# Patient Record
Sex: Female | Born: 1975 | Race: White | Hispanic: No | Marital: Married | Smoking: Never smoker
Health system: Southern US, Community
[De-identification: ages and names within clinical notes are randomized; demographics above are authoritative.]

## PROBLEM LIST (undated history)

## (undated) DIAGNOSIS — J302 Other seasonal allergic rhinitis: Secondary | ICD-10-CM

## (undated) HISTORY — DX: Other seasonal allergic rhinitis: J30.2

## (undated) HISTORY — PX: TUBAL LIGATION: SHX77

---

## 2005-08-29 ENCOUNTER — Emergency Department (HOSPITAL_COMMUNITY): Admission: EM | Admit: 2005-08-29 | Discharge: 2005-08-29 | Payer: Self-pay | Admitting: Emergency Medicine

## 2006-10-22 ENCOUNTER — Ambulatory Visit (HOSPITAL_COMMUNITY): Admission: AD | Admit: 2006-10-22 | Discharge: 2006-10-22 | Payer: Self-pay | Admitting: Obstetrics and Gynecology

## 2006-11-28 ENCOUNTER — Ambulatory Visit (HOSPITAL_COMMUNITY): Admission: AD | Admit: 2006-11-28 | Discharge: 2006-11-28 | Payer: Self-pay | Admitting: Obstetrics & Gynecology

## 2007-01-25 ENCOUNTER — Inpatient Hospital Stay (HOSPITAL_COMMUNITY): Admission: AD | Admit: 2007-01-25 | Discharge: 2007-01-29 | Payer: Self-pay | Admitting: Obstetrics & Gynecology

## 2007-01-26 ENCOUNTER — Encounter (INDEPENDENT_AMBULATORY_CARE_PROVIDER_SITE_OTHER): Payer: Self-pay | Admitting: Specialist

## 2007-12-12 ENCOUNTER — Other Ambulatory Visit: Admission: RE | Admit: 2007-12-12 | Discharge: 2007-12-12 | Payer: Self-pay | Admitting: Obstetrics & Gynecology

## 2009-01-11 ENCOUNTER — Other Ambulatory Visit: Admission: RE | Admit: 2009-01-11 | Discharge: 2009-01-11 | Payer: Self-pay | Admitting: Obstetrics and Gynecology

## 2009-11-05 ENCOUNTER — Ambulatory Visit (HOSPITAL_COMMUNITY): Admission: RE | Admit: 2009-11-05 | Discharge: 2009-11-05 | Payer: Self-pay | Admitting: Obstetrics & Gynecology

## 2009-11-19 ENCOUNTER — Ambulatory Visit (HOSPITAL_COMMUNITY): Admission: RE | Admit: 2009-11-19 | Discharge: 2009-11-19 | Payer: Self-pay | Admitting: Obstetrics & Gynecology

## 2010-03-11 ENCOUNTER — Inpatient Hospital Stay (HOSPITAL_COMMUNITY): Admission: AD | Admit: 2010-03-11 | Discharge: 2010-03-14 | Payer: Self-pay | Admitting: Obstetrics & Gynecology

## 2010-03-11 ENCOUNTER — Encounter: Payer: Self-pay | Admitting: Obstetrics & Gynecology

## 2010-03-11 ENCOUNTER — Ambulatory Visit: Payer: Self-pay | Admitting: Obstetrics & Gynecology

## 2010-10-13 IMAGING — US US OB DETAIL+14 WK
1 series · 18 of 28 positions shown · non-contrast
Comparison: none

OBSTETRICAL ULTRASOUND:
 This ultrasound was performed in The [HOSPITAL], and the [facility] report will be stored to [REDACTED] PACS.  This report is also available in [HOSPITAL]?s accessANYware.

[Series 1: us ob detail+14 wk · 18 of 88 slices shown]
[im 1/88]
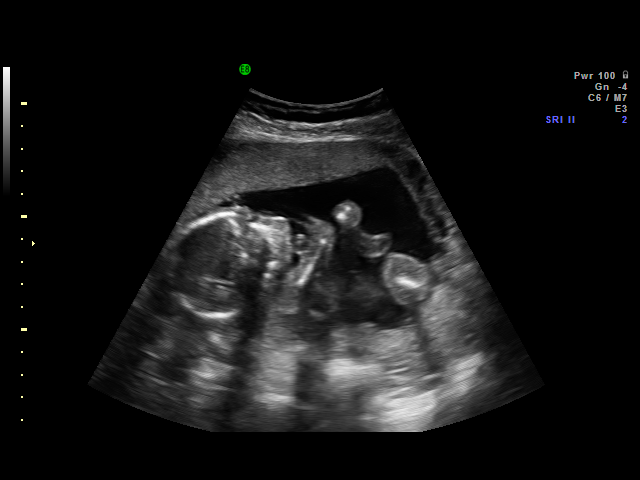
[im 7/88]
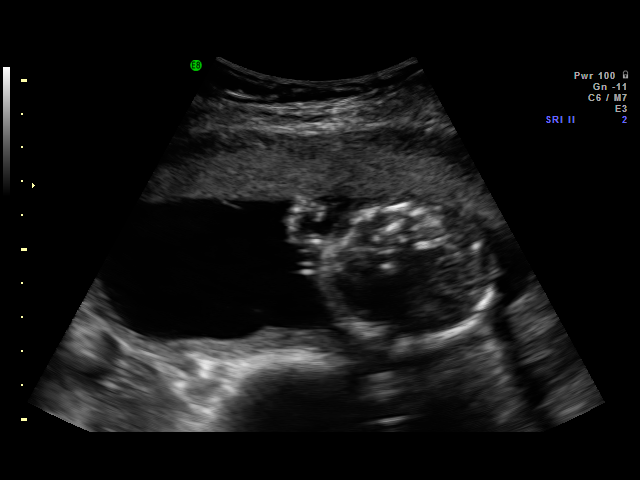
[im 10/88]
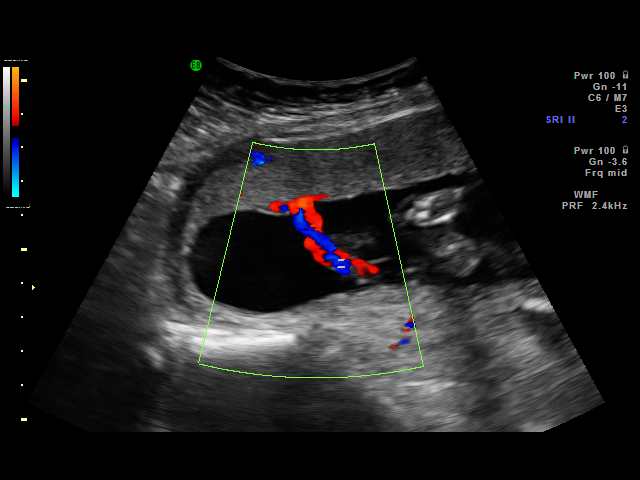
[im 17/88]
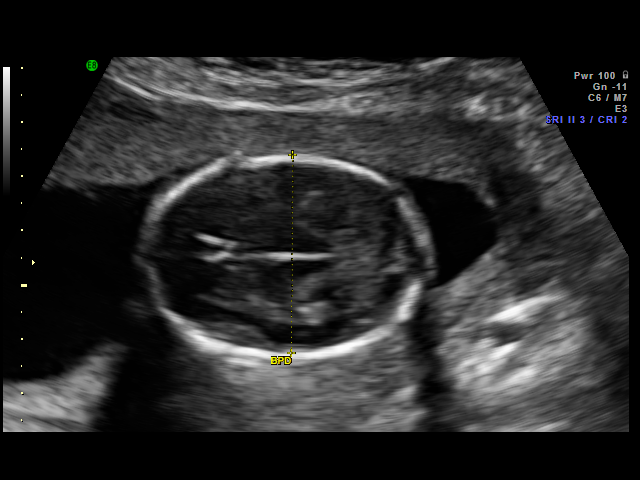
[im 23/88]
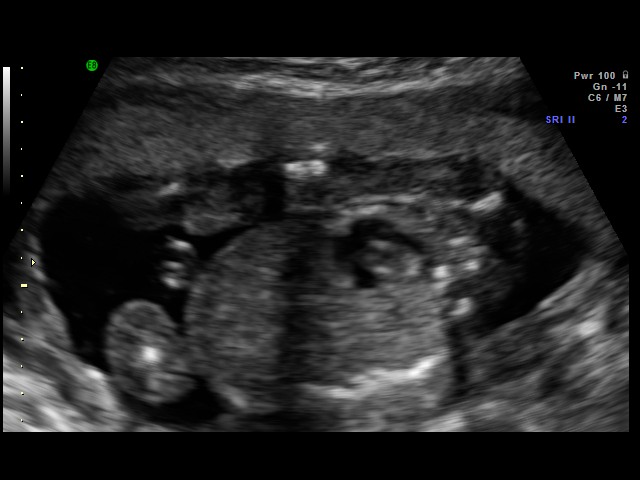
[im 26/88]
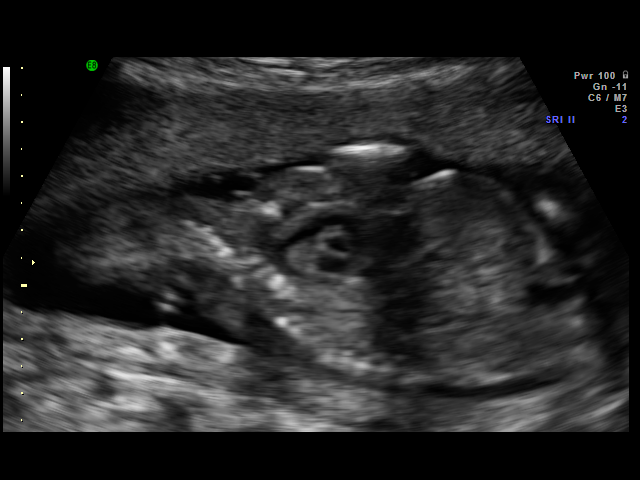
[im 33/88]
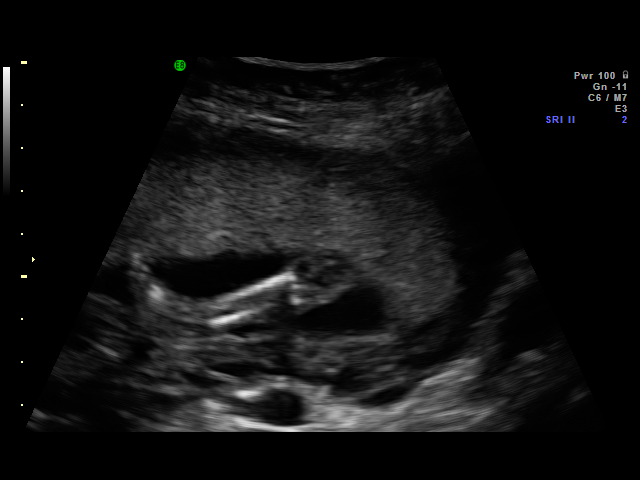
[im 36/88]
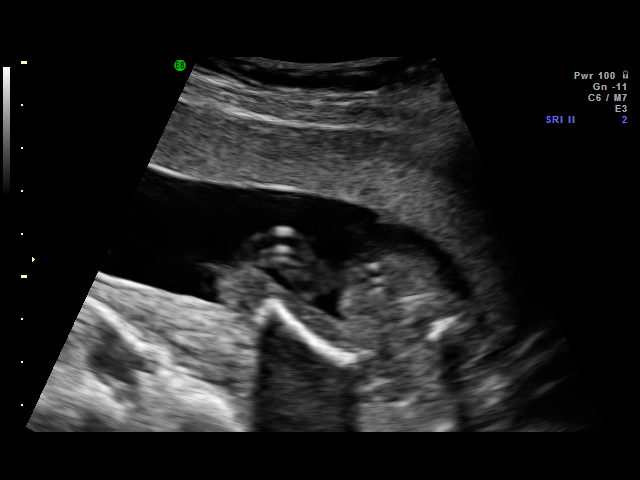
[im 42/88]
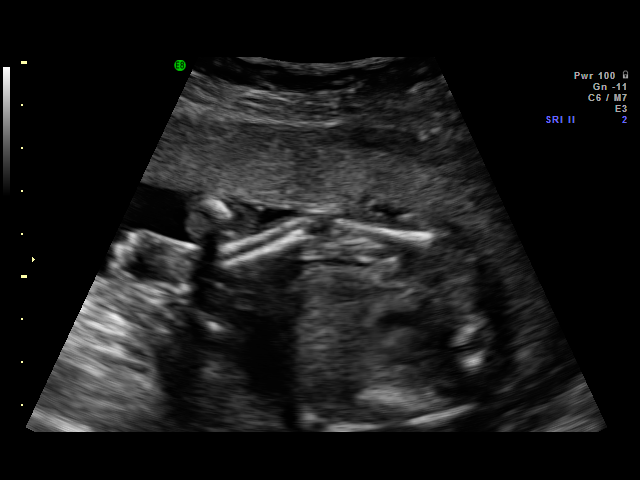
[im 46/88]
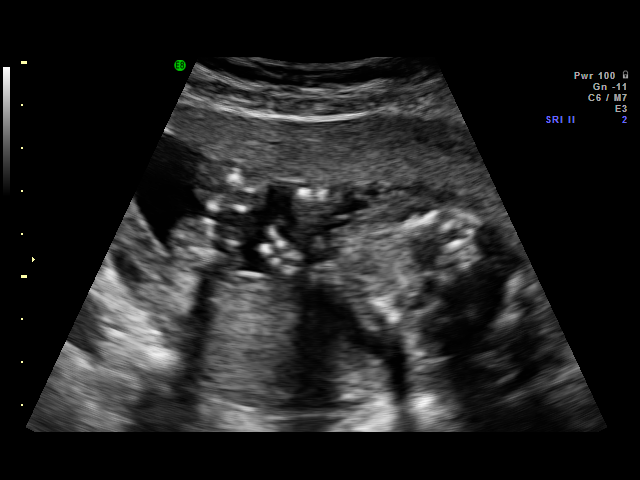
[im 52/88]
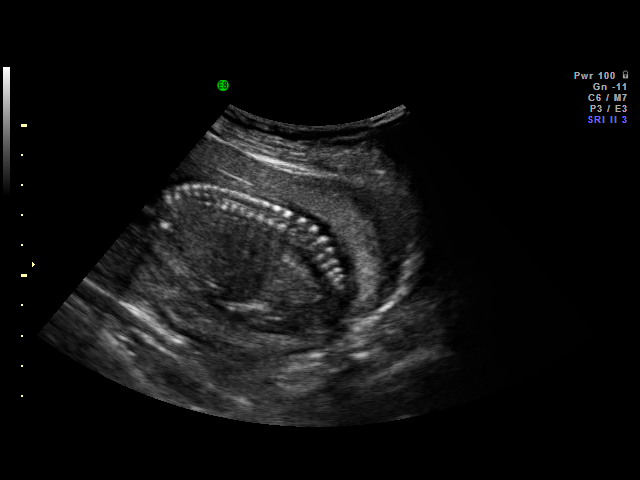
[im 55/88]
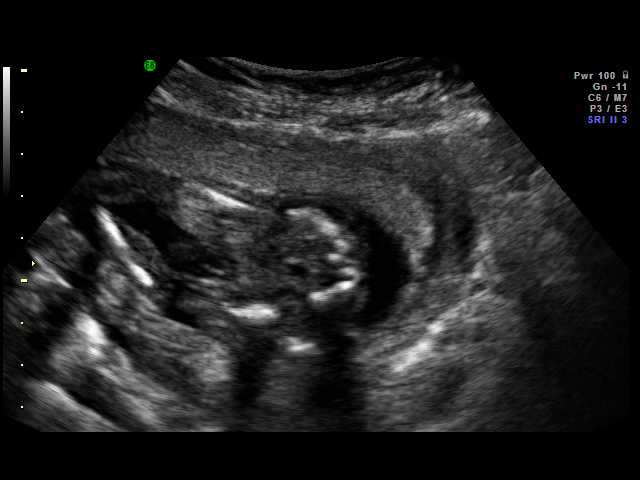
[im 62/88]
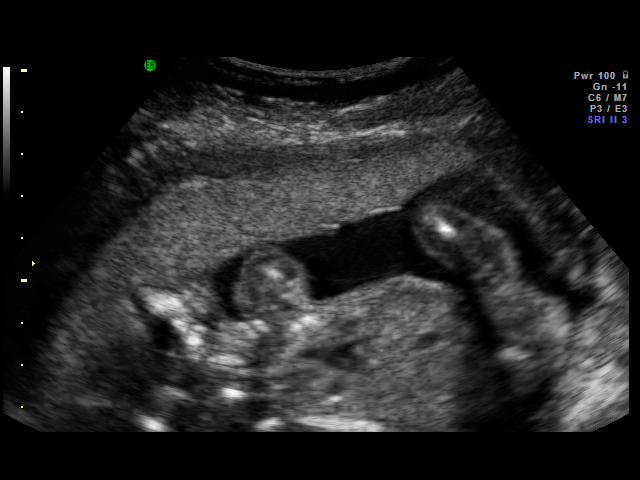
[im 68/88]
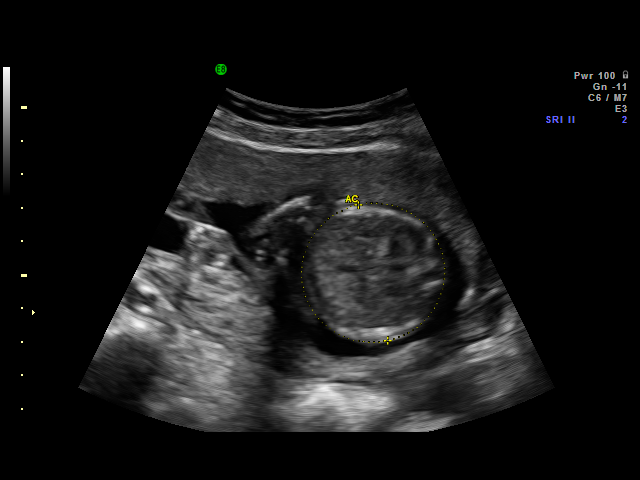
[im 71/88]
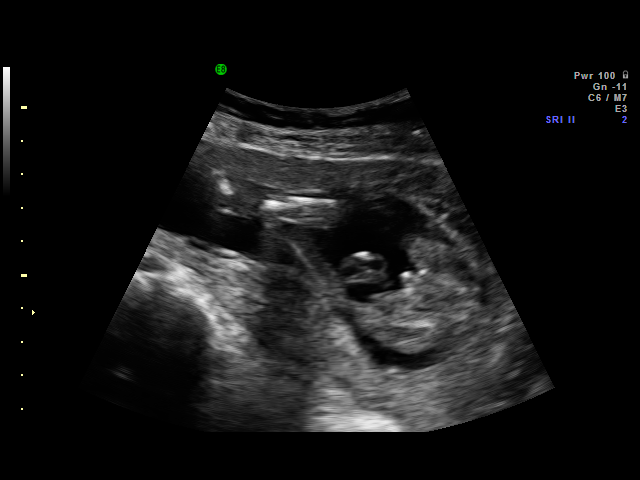
[im 78/88]
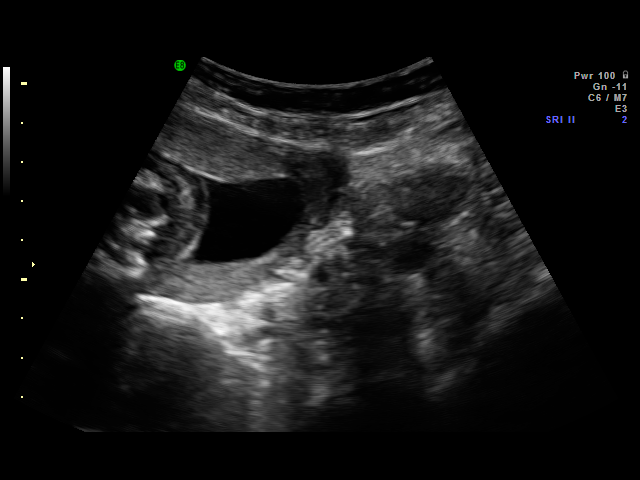
[im 81/88]
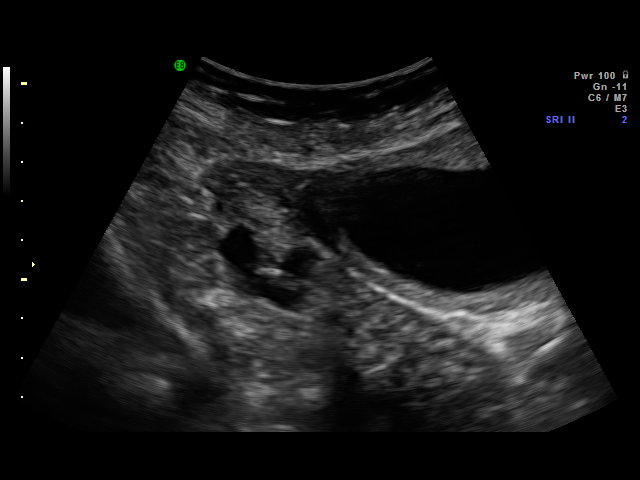
[im 88/88]
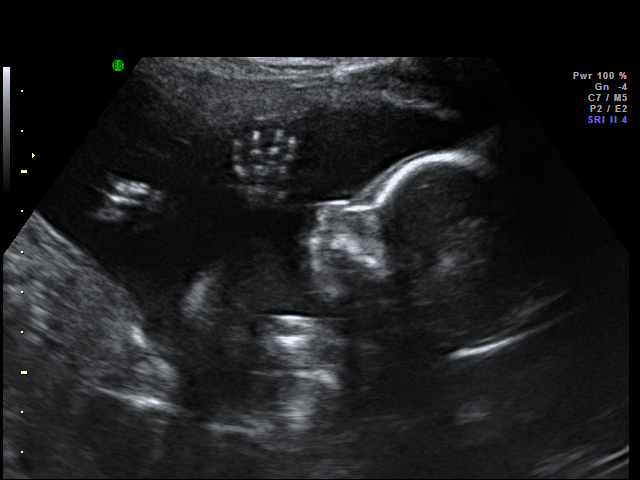

[18 of 28 positions shown; findings below may reference images not displayed]

IMPRESSION: [facility] has also been faxed to the ordering physician.

## 2010-10-17 ENCOUNTER — Other Ambulatory Visit
Admission: RE | Admit: 2010-10-17 | Discharge: 2010-10-17 | Payer: Self-pay | Source: Home / Self Care | Admitting: Obstetrics & Gynecology

## 2011-01-20 LAB — CBC
HCT: 39.4 % (ref 36.0–46.0)
Hemoglobin: 10.8 g/dL — ABNORMAL LOW (ref 12.0–15.0)
MCHC: 34.2 g/dL (ref 30.0–36.0)
MCV: 92.5 fL (ref 78.0–100.0)
Platelets: 241 10*3/uL (ref 150–400)
RBC: 4.26 MIL/uL (ref 3.87–5.11)
WBC: 12.4 10*3/uL — ABNORMAL HIGH (ref 4.0–10.5)

## 2011-01-20 LAB — RPR: RPR Ser Ql: NONREACTIVE

## 2011-01-20 LAB — COMPREHENSIVE METABOLIC PANEL
ALT: 18 U/L (ref 0–35)
AST: 23 U/L (ref 0–37)
BUN: 8 mg/dL (ref 6–23)
Calcium: 9.7 mg/dL (ref 8.4–10.5)
Chloride: 106 mEq/L (ref 96–112)
Creatinine, Ser: 0.58 mg/dL (ref 0.4–1.2)
GFR calc non Af Amer: >60 mL/min (ref >60)
Potassium: 4.6 mEq/L (ref 3.5–5.1)
Sodium: 135 mEq/L (ref 135–145)
Total Bilirubin: 0.5 mg/dL (ref 0.3–1.2)
Total Protein: 6.5 g/dL (ref 6.0–8.3)

## 2011-01-20 LAB — URIC ACID: Uric Acid, Serum: 5.4 mg/dL (ref 2.4–7.0)

## 2011-03-20 NOTE — Op Note (Signed)
[name], [name]               ACCOUNT NO.:  [id number]   MEDICAL RECORD NO.:  [id number]          PATIENT TYPE:  INP   LOCATION:  A401                          FACILITY:  [facility]   PHYSICIAN:  Tilda Burrow, M.D. DATE OF BIRTH:  [birth year]   DATE OF PROCEDURE:  01/26/2007  DATE OF DISCHARGE:                               OPERATIVE REPORT   PREOPERATIVE DIAGNOSIS:  Pregnancy, 38 weeks 5 days, pre-eclampsia,  cephalopelvic disproportion.   POSTOPERATIVE DIAGNOSIS:  Pregnancy, 38 weeks 5 days, pre-eclampsia,  cephalopelvic disproportion, loose nuchal cord x1, body cord x1.   PROCEDURE:  Primary low transverse cervical cesarean section.   SURGEON:  Tilda Burrow, M.D.   ASSISTANT:  Zerita Boers, C.N.M.   ANESTHESIA:  Epidural.   ANESTHESIOLOGIST:  Tilda Burrow, M.D.   COMPLICATIONS:  None.   FINDINGS:  8 pounds 1.3 ounce female infant, Apgars 9/9, occiput posterior  presentation, nuchal cord x1, body cord x1.   INDICATIONS FOR PROCEDURE:  The patient reached completely dilated and  pushed for approximately an hour with absolutely no change in the  descent of the vertex, head at +1 station.  The pelvis was considered  very funneled with prominent coccyx as well as a very low positioned  pubic ramus.   DETAILS OF PROCEDURE:  The patient was taken to the operating room after  CPD diagnosed and epidural topped off by Glynn Octave, CRNA.  A  Pfannenstiel incision was made with easy development of the incision.  There were huge varicosities in the anterior abdominal wall that  required point cautery.  The peritoneum was entered easily without  difficulty, the bladder flap developed on the well developed low uterine  segment and a transverse uterine incision performed without difficulty  and extended laterally with index finger traction and fetal vertex eased  into the incision, rotated from its occiput posterior presentation into  the incision, and delivered by fundal  pressure.  He would not release  completely, so vacuum assistance was placed on the occiput and used to  guide the baby between the rectus muscles without further difficulty.  Nuchal cord x1 and body cord x1 was unwound.  The infant was delivered  and passed to Dr. Milinda Cave for his subsequent care, see his notes for  details.   The infant was taken upstairs.  Cord blood samples were obtained,  results are pending elsewhere.  The uterus was irrigated with saline  solution and closed with a single layer of running locking closure and  subsequent antibiotic dose of Ancef given IV.  One interrupted suture  was necessary on the lower uterine segment to complete hemostasis here.  The bladder flap was closed using 2-0 chromic.  The flexible retractor  was removed and the anterior peritoneum closed with 2-0 chromic, the  rectus muscles reapproximated with interrupted 2-0 plain x3.  The fascia  was closed with 0 Vicryl.  The subcutaneous fatty tissues were  reapproximated with interrupted 2-0 plain and staple closure of the skin  completed the procedure.  The patient tolerated the procedure well and  went to the recovery  room in excellent condition.  Estimated blood loss  500 mL.  Urine output approximately 125 mL, though Foley bag was changed  due to leakage of the bag.  The Foley urine output was slightly bloody  from the patient's pushing.      Tilda Burrow, M.D.  Electronically Signed     JVF/MEDQ  D:  01/26/2007  T:  01/26/2007  Job:  [id number]   cc:   Jeoffrey Massed, MD  Fax: [phone]

## 2011-03-20 NOTE — H&P (Signed)
[name], [name]               ACCOUNT NO.:  [id number]   MEDICAL RECORD NO.:  [id number]          PATIENT TYPE:  OIB   LOCATION:  LDR3                          FACILITY:  [facility]   PHYSICIAN:  Lazaro Arms, M.D.   DATE OF BIRTH:  03-04-1976   DATE OF ADMISSION:  01/25/2007  DATE OF DISCHARGE:  LH                              HISTORY & PHYSICAL   REASON FOR ADMISSION:  Pregnancy at 38 weeks and 5 days with  preeclampsia.   MEDICAL HISTORY:  Negative.   PAST SURGICAL HISTORY:  Negative.   ALLERGIES:  She is allergic to SULFA.   FAMILY HISTORY:  Positive for hypertension.   PRENATAL COURSE:  Essentially uneventful. Blood type AB positive. UDS  negative, rubella immune, hepatitis B surface antigen negative, HIV  negative. GC, chlamydia negative on both cultures. HSV-2 negative. AFP  within normal limits. GBS is positive. A 28-week hemoglobin 12.1,  hematocrit 37.6. One hour glucose 154. GBS is positive.   PHYSICAL EXAMINATION:  VITAL SIGNS:  Weight 168, blood pressure 152/92.  There is a trace of protein in her urine.  EXTREMITIES:  There is 3+, 4+ pitting edema from the thighs down.  ABDOMEN:  Positive fetal movement. Fundal height is 37 cm. Fetal heart  rate is 120 strong and regular. There are 4+ DTRs.  HEART:  Regular rhythm and rate.  LUNGS:  Clear to auscultation bilaterally.  NEUROLOGICAL:  There is no clonus with DTRs.  GENITORECTAL:  Vaginal exam:  Cervix is a fingertip, 70%, -2 station.  She does have a flat pelvic arch.   Dr. Despina Hidden was consulted and examined the patient. Discussed risks and  benefits in regards to Cesarean section versus induction and vaginal  delivery. Patient opts for cesarean section when the decision is made  for time of delivery. Patient verbalized understanding in regards to  diagnosis, plan of care. She is to be admitted today.      Zerita Boers, N.M.      Lazaro Arms, M.D.  Electronically Signed    DL/MEDQ  D:  16/08/9603   T:  01/25/2007  Job:  [id number]   cc:   Jeoffrey Massed, MD  Fax: [phone]   [facility]

## 2011-03-20 NOTE — Consult Note (Signed)
[name], [name]               ACCOUNT NO.:  [id number]   MEDICAL RECORD NO.:  [id number]          PATIENT TYPE:  OIB   LOCATION:  A415                          FACILITY:  [facility]   PHYSICIAN:  Tilda Burrow, M.D. DATE OF BIRTH:  Mar 27, 1976   DATE OF CONSULTATION:  DATE OF DISCHARGE:                                 CONSULTATION   CHIEF COMPLAINT:  Left lower quadrant abdominal pain, nausea and  vomiting.   HISTORY OF PRESENT ILLNESS:  A 35 year old primiparous female due February 03, 2007 by menstrual and ultrasound criteria, who presents complaining  of abdominal discomfort, later in the day after a routine office visit.  She received Nubain and Phenergan, which helped her with her pain.  Urinalysis was negative. An abdominal examination shows non-tender  abdomen. There are no uterine contractions.   IMPRESSION:  She either has early gastrointestinal discomfort or  functional discomfort with pregnancy.   PLAN:  She will be followed up in our office. The patient is to call for  persistent discomfort through the weekend.      Tilda Burrow, M.D.  Electronically Signed     JVF/MEDQ  D:  10/22/2006  T:  10/24/2006  Job:  [id number]

## 2011-03-20 NOTE — Discharge Summary (Signed)
[name], [name]               ACCOUNT NO.:  [id number]   MEDICAL RECORD NO.:  [id number]          PATIENT TYPE:  INP   LOCATION:  A401                          FACILITY:  [facility]   PHYSICIAN:  Tilda Burrow, M.D. DATE OF BIRTH:  [birth year]   DATE OF ADMISSION:  01/25/2007  DATE OF DISCHARGE:  03/29/2008LH                               DISCHARGE SUMMARY   ADMISSION DIAGNOSES:  1. Pregnancy at 38 weeks and five days.  2. Pre-eclampsia.  3. Medically-indicated induction of labor.   DISCHARGE DIAGNOSES:  1. Pregnancy at 38 weeks and five days.  2. Mild pre-eclampsia.  3. Cephalopelvic disproportion.   PROCEDURE:  1. On January 25, 2007, a Foley bulb,  cervical ripening, Dr. Tilda Burrow.  Pitocin induction of labor.  2. On January 26, 2007, Pitocin induction of labor.  Also continuous      lumbar epidural catheter placement , Dr. Emelda Fear.  Also on January 26, 2007, a primary low transverse cervical cesarean section, Dr.      Emelda Fear.   DISCHARGE MEDICATIONS:  1. Motrin 800 mg, one p.o. q.8h. p.r.n. cramps or pain.  2. Hydrochlorothiazide 25 mg p.o. q.d. x30 days.  3. K-Dur 20 mEq, #30 tab, one p.o. q.a.m. x30 days.  4. Prenatal vitamin, one p.o. q.d.   FOLLOWUP:  In two days for staple removal and incision check, then in  four weeks.   HISTORY:  This 35 year old primiparous female before March 2008, was  admitted for pre-eclampsia.  Examination  in the office raised the  question of adequacy of the pelvis due to a very convergent outlet, a  flat sacrum anteriorly and narrow pelvic side walls.  The cervix was  favorable at 2 cm.  After a discussion, we decided to attempt an  induction rather than a primary cesarean section.   HOSPITAL COURSE:  She reached completely dilated by 5 p.m. on January 26, 2007, but could not push the baby absolutely no way past +1 station.  A  primary cesarean section was performed.  The vertex was in a occipital  posterior  presentation.  There was an 8 pound 1.3 ounce infant with  nuchal cord x1, body cord x1.   Postoperatively the patient did excellently.  She had been admitted with  an admitting hemoglobin of 11.8, hematocrit 35.4, with platelets of  301,000.  Postoperatively her hemoglobin was 10.1, hematocrit 29.9,  platelets remained excellent at 254,000.  She had marked 4+ edema upon  admission, and this remained consistent during the hospitalization.  Her  weight was 168 pounds on admission and dropped to 164 on postoperative  day one.   DISPOSITION/CONDITION ON DISCHARGE:  She was discharged in excellent  condition, having chosen to bottle feed.   FOLLOWUP:  She will be followed up shortly in our office.  Contraceptions plans are undecided.      Tilda Burrow, M.D.  Electronically Signed     JVF/MEDQ  D:  01/29/2007  T:  01/29/2007  Job:  [id number]   cc:   Loistine Chance  Hoover Browns, MD  Fax: [phone]

## 2011-03-20 NOTE — Op Note (Signed)
[name], [name]               ACCOUNT NO.:  [id number]   MEDICAL RECORD NO.:  [id number]          PATIENT TYPE:  INP   LOCATION:  LDR3                          FACILITY:  [facility]   PHYSICIAN:  Tilda Burrow, M.D. DATE OF BIRTH:  October 14, 1976   DATE OF PROCEDURE:  01/26/2007  DATE OF DISCHARGE:                               OPERATIVE REPORT   PROCEDURE PERFORMED:  Epidural catheter placement.   INDICATIONS FOR PROCEDURE:  Medically indicated induction for pre-  eclampsia with cervical dilation to 4 cm.   DETAILS OF PROCEDURE:  Continuous lumbar epidural catheter placed after  fluid bolus and consent obtained with the patient sitting in position  flexed forward.  L3-4 interspace was prepped, draped, local wheal of  anesthesia performed followed by insertion of a Tuohy needle.  A loss of  resistance technique identified the epidural space at 6 cm beneath the  skin with 3 mL test dose of 1.5% Xylocaine with epinephrine injected.  The epidural catheter was threaded 3.5 cm into the epidural space, Tuohy  needle removed, catheter taped to the back and an additional 3 mL of  1.5% lidocaine with epinephrine injected through the catheter.  Maternal  heart rate remained normal.  The patient then had a bolus of 7 mL of  0.125% Marcaine solution followed by 14 mL per hour.  She had T8  analgesic effect noted and normal blood pressures noted.  Cervical exam  post procedure showed the cervix stretchable to 4 cm, 75% effaced, -2.  Patient has a very low symphysis pubis and this will an issue during  delivery, but contractions are excellent.  Fetal heart rate remains  normal.  Good prognosis for vaginal delivery is anticipated.      Tilda Burrow, M.D.  Electronically Signed     JVF/MEDQ  D:  01/26/2007  T:  01/26/2007  Job:  [id number]

## 2012-02-19 ENCOUNTER — Other Ambulatory Visit: Payer: Self-pay | Admitting: Obstetrics & Gynecology

## 2012-02-19 ENCOUNTER — Other Ambulatory Visit (HOSPITAL_COMMUNITY)
Admission: RE | Admit: 2012-02-19 | Discharge: 2012-02-19 | Disposition: A | Discharge status: Home / Self Care | Payer: BC Managed Care – PPO | Source: Ambulatory Visit | Attending: Obstetrics & Gynecology | Admitting: Obstetrics & Gynecology

## 2012-02-19 DIAGNOSIS — Z01419 Encounter for gynecological examination (general) (routine) without abnormal findings: Secondary | ICD-10-CM | POA: Insufficient documentation

## 2013-03-09 ENCOUNTER — Encounter: Payer: Self-pay | Admitting: *Deleted

## 2013-03-10 ENCOUNTER — Encounter: Payer: Self-pay | Admitting: Obstetrics & Gynecology

## 2013-03-10 ENCOUNTER — Ambulatory Visit (INDEPENDENT_AMBULATORY_CARE_PROVIDER_SITE_OTHER): Payer: BC Managed Care – PPO | Admitting: Obstetrics & Gynecology

## 2013-03-10 VITALS — BP 90/60 | Ht 65.0 in | Wt 128.0 lb

## 2013-03-10 DIAGNOSIS — Z01419 Encounter for gynecological examination (general) (routine) without abnormal findings: Secondary | ICD-10-CM

## 2013-03-10 NOTE — Progress Notes (Signed)
Patient ID: [name], female   DOB: 1975/12/15, 37 y.o.   MRN: [id number] Subjective:     [name] is a 37 y.o. female here for a routine exam.  Patient's last menstrual period was 03/07/2013. G2P1100 Current complaints: none.  Personal health questionnaire reviewed: yes.   Gynecologic History Patient's last menstrual period was 03/07/2013. Contraception: tubal ligation Last Pap: 2013. Results were: normal Last mammogram: na. Results were: na  Obstetric History OB History   Grav Para Term Preterm Abortions TAB SAB Ect Mult Living   2 2 1 1            # Outc Date GA Lbr Len/2nd Wgt Sex Del Anes PTL Lv   1 TRM 3/08 [redacted]w[redacted]d  8lb1.3oz(3.666kg) M LTCS EPI     2 PRE 5/11 [redacted]w[redacted]d  5lb10oz(2.551kg) M LTCS Spinal         The following portions of the patient's history were reviewed and updated as appropriate: allergies, current medications, past family history, past medical history, past social history, past surgical history and problem list.  Review of Systems  Review of Systems  Constitutional: Negative for fever, chills, weight loss, malaise/fatigue and diaphoresis.  HENT: Negative for hearing loss, ear pain, nosebleeds, congestion, sore throat, neck pain, tinnitus and ear discharge.   Eyes: Negative for blurred vision, double vision, photophobia, pain, discharge and redness.  Respiratory: Negative for cough, hemoptysis, sputum production, shortness of breath, wheezing and stridor.   Cardiovascular: Negative for chest pain, palpitations, orthopnea, claudication, leg swelling and PND.  Gastrointestinal: negative for abdominal pain. Negative for heartburn, nausea, vomiting, diarrhea, constipation, blood in stool and melena.  Genitourinary: Negative for dysuria, urgency, frequency, hematuria and flank pain.  Musculoskeletal: Negative for myalgias, back pain, joint pain and falls.  Skin: Negative for itching and rash.  Neurological: Negative for dizziness, tingling, tremors,  sensory change, speech change, focal weakness, seizures, loss of consciousness, weakness and headaches.  Endo/Heme/Allergies: Negative for environmental allergies and polydipsia. Does not bruise/bleed easily.  Psychiatric/Behavioral: Negative for depression, suicidal ideas, hallucinations, memory loss and substance abuse. The patient is not nervous/anxious and does not have insomnia.        Objective:    Physical Exam  Vitals reviewed. Constitutional: She is oriented to person, place, and time. She appears well-developed and well-nourished.  HENT:  Head: Normocephalic and atraumatic.        Right Ear: External ear normal.  Left Ear: External ear normal.  Nose: Nose normal.  Mouth/Throat: Oropharynx is clear and moist.  Eyes: Conjunctivae and EOM are normal. Pupils are equal, round, and reactive to light. Right eye exhibits no discharge. Left eye exhibits no discharge. No scleral icterus.  Neck: Normal range of motion. Neck supple. No tracheal deviation present. No thyromegaly present.  Cardiovascular: Normal rate, regular rhythm, normal heart sounds and intact distal pulses.  Exam reveals no gallop and no friction rub.   No murmur heard. Respiratory: Effort normal and breath sounds normal. No respiratory distress. She has no wheezes. She has no rales. She exhibits no tenderness.  GI: Soft. Bowel sounds are normal. She exhibits no distension and no mass. There is no tenderness. There is no rebound and no guarding.  Genitourinary:       Vulva is normal without lesions Vagina is pink moist without discharge Cervix normal in appearance and pap is done Uterus is normal size shape and contour Adnexa is negative with normal sized ovaries   Musculoskeletal: Normal range of motion. She exhibits no  edema and no tenderness.  Neurological: She is alert and oriented to person, place, and time. She has normal reflexes. She displays normal reflexes. No cranial nerve deficit. She exhibits normal muscle  tone. Coordination normal.  Skin: Skin is warm and dry. No rash noted. No erythema. No pallor.  Psychiatric: She has a normal mood and affect. Her behavior is normal. Judgment and thought content normal.       Assessment:    Healthy female exam.    Plan:    Education reviewed: none. Mammogram ordered.

## 2013-03-10 NOTE — Patient Instructions (Addendum)
Mammography Mammography is an X-ray of the breasts to look for changes that are not normal. The X-ray image is called a mammogram. This procedure can screen for breast cancer, can detect cancer early, and can diagnose cancer.  LET YOUR CAREGIVER KNOW ABOUT:  Breast implants.  Previous breast disease, biopsy, or surgery.  If you are breastfeeding.  Medicines taken, including vitamins, herbs, eyedrops, over-the-counter medicines, and creams.  Use of steroids (by mouth or creams).  Possibility of pregnancy, if this applies. RISKS AND COMPLICATIONS  Exposure to radiation, but at very low levels.  The results may be misinterpreted.  The results may not be accurate.  Mammography may lead to further tests.  Mammography may not catch certain cancers. BEFORE THE PROCEDURE  Schedule your test about 7 days after your menstrual period. This is when your breasts are the least tender and have signs of hormone changes.  If you have had a mammography done at a different facility in the past, get the mammogram X-rays or have them sent to your current exam facility in order to compare them.  Wash your breasts and under your arms the day of the test.  Do not wear deodorants, perfumes, or powders anywhere on your body.  Wear clothes that you can change in and out of easily. PROCEDURE Relax as much as possible during the test. Any discomfort during the test will be very brief. The test should take less than 30 minutes. The following will happen:  You will undress from the waist up and put on a gown.  You will stand in front of the X-ray machine.  Each breast will be placed between 2 plastic or glass plates. The plates will compress your breast for a few seconds.  X-rays will be taken from different angles of the breast. AFTER THE PROCEDURE  The mammogram will be examined.  Depending on the quality of the images, you may need to repeat certain parts of the test.  Ask when your test  results will be ready. Make sure you get your test results.  You may resume normal activities. Document Released: 10/16/2000 Document Revised: 01/11/2012 Document Reviewed: 08/09/2011 [employer] Patient Information 2013 [employer].  

## 2013-03-17 ENCOUNTER — Other Ambulatory Visit: Payer: Self-pay | Admitting: Obstetrics & Gynecology

## 2014-08-28 ENCOUNTER — Ambulatory Visit (INDEPENDENT_AMBULATORY_CARE_PROVIDER_SITE_OTHER): Payer: BC Managed Care – PPO | Admitting: Obstetrics & Gynecology

## 2014-08-28 ENCOUNTER — Encounter: Payer: Self-pay | Admitting: Obstetrics & Gynecology

## 2014-08-28 ENCOUNTER — Other Ambulatory Visit (HOSPITAL_COMMUNITY)
Admission: RE | Admit: 2014-08-28 | Discharge: 2014-08-28 | Disposition: A | Discharge status: Home / Self Care | Payer: BC Managed Care – PPO | Source: Ambulatory Visit | Attending: Obstetrics & Gynecology | Admitting: Obstetrics & Gynecology

## 2014-08-28 VITALS — BP 108/80 | Ht 65.0 in | Wt 120.0 lb

## 2014-08-28 DIAGNOSIS — Z1151 Encounter for screening for human papillomavirus (HPV): Secondary | ICD-10-CM | POA: Insufficient documentation

## 2014-08-28 DIAGNOSIS — Z01419 Encounter for gynecological examination (general) (routine) without abnormal findings: Secondary | ICD-10-CM

## 2014-08-28 NOTE — Progress Notes (Signed)
Patient ID: [name], female   DOB: 19-Sep-1976, 38 y.o.   MRN: [id number] Subjective:     [name] is a 38 y.o. female here for a routine exam.  Patient's last menstrual period was 08/02/2014. G2P1100 Birth Control Method:  BTL Menstrual Calendar(currently): regular  Current complaints: none.   Current acute medical issues:  none   Recent Gynecologic History Patient's last menstrual period was 08/02/2014. Last Pap: 2014,  normal Last mammogram: na,    Past Medical History  Diagnosis Date  . Seasonal allergies     Past Surgical History  Procedure Laterality Date  . Cesarean section  2008,2011  . Tubal ligation      OB History   Grav Para Term Preterm Abortions TAB SAB Ect Mult Living   2 2 1 1             History   Social History  . Marital Status: Married    Spouse Name: N/A    Number of Children: N/A  . Years of Education: N/A   Social History Main Topics  . Smoking status: Never Smoker   . Smokeless tobacco: Never Used  . Alcohol Use: No  . Drug Use: No  . Sexual Activity: Yes    Birth Control/ Protection: Surgical   Other Topics Concern  . None   Social History Narrative  . None    Family History  Problem Relation Age of Onset  . Hypertension Mother   . Hyperlipidemia Mother   . Cancer Other     breast  . Diabetes Maternal Aunt   . Hypertension Maternal Grandmother     Current outpatient prescriptions:fexofenadine (ALLEGRA) 180 MG tablet, Take 180 mg by mouth as needed. , Disp: , Rfl: ;  ibuprofen (ADVIL,MOTRIN) 200 MG tablet, Take 200 mg by mouth every 6 (six) hours as needed., Disp: , Rfl:   Review of Systems  Review of Systems  Constitutional: Negative for fever, chills, weight loss, malaise/fatigue and diaphoresis.  HENT: Negative for hearing loss, ear pain, nosebleeds, congestion, sore throat, neck pain, tinnitus and ear discharge.   Eyes: Negative for blurred vision, double vision, photophobia, pain, discharge and redness.   Respiratory: Negative for cough, hemoptysis, sputum production, shortness of breath, wheezing and stridor.   Cardiovascular: Negative for chest pain, palpitations, orthopnea, claudication, leg swelling and PND.  Gastrointestinal: negative for abdominal pain. Negative for heartburn, nausea, vomiting, diarrhea, constipation, blood in stool and melena.  Genitourinary: Negative for dysuria, urgency, frequency, hematuria and flank pain.  Musculoskeletal: Negative for myalgias, back pain, joint pain and falls.  Skin: Negative for itching and rash.  Neurological: Negative for dizziness, tingling, tremors, sensory change, speech change, focal weakness, seizures, loss of consciousness, weakness and headaches.  Endo/Heme/Allergies: Negative for environmental allergies and polydipsia. Does not bruise/bleed easily.  Psychiatric/Behavioral: Negative for depression, suicidal ideas, hallucinations, memory loss and substance abuse. The patient is not nervous/anxious and does not have insomnia.        Objective:  Blood pressure 108/80, height 5\' 5"  (1.651 m), weight 120 lb (54.432 kg), last menstrual period 08/02/2014.   Physical Exam  Vitals reviewed. Constitutional: She is oriented to person, place, and time. She appears well-developed and well-nourished.  HENT:  Head: Normocephalic and atraumatic.        Right Ear: External ear normal.  Left Ear: External ear normal.  Nose: Nose normal.  Mouth/Throat: Oropharynx is clear and moist.  Eyes: Conjunctivae and EOM are normal. Pupils are equal, round, and reactive  to light. Right eye exhibits no discharge. Left eye exhibits no discharge. No scleral icterus.  Neck: Normal range of motion. Neck supple. No tracheal deviation present. No thyromegaly present.  Cardiovascular: Normal rate, regular rhythm, normal heart sounds and intact distal pulses.  Exam reveals no gallop and no friction rub.   No murmur heard. Respiratory: Effort normal and breath sounds  normal. No respiratory distress. She has no wheezes. She has no rales. She exhibits no tenderness.  GI: Soft. Bowel sounds are normal. She exhibits no distension and no mass. There is no tenderness. There is no rebound and no guarding.  Genitourinary:  Breasts no masses skin changes or nipple changes bilaterally      Vulva is normal without lesions Vagina is pink moist without discharge Cervix normal in appearance and pap is done Uterus is normal size shape and contour Adnexa is negative with normal sized ovaries   Musculoskeletal: Normal range of motion. She exhibits no edema and no tenderness.  Neurological: She is alert and oriented to person, place, and time. She has normal reflexes. She displays normal reflexes. No cranial nerve deficit. She exhibits normal muscle tone. Coordination normal.  Skin: Skin is warm and dry. No rash noted. No erythema. No pallor.  Psychiatric: She has a normal mood and affect. Her behavior is normal. Judgment and thought content normal.       Assessment:    Healthy female exam.    Plan:    Contraception: tubal ligation. Follow up in: 1 year.

## 2014-08-29 LAB — CYTOLOGY - PAP

## 2014-09-03 ENCOUNTER — Encounter: Payer: Self-pay | Admitting: Obstetrics & Gynecology

## 2015-09-02 ENCOUNTER — Other Ambulatory Visit: Payer: Self-pay | Admitting: Obstetrics & Gynecology

## 2015-09-03 ENCOUNTER — Other Ambulatory Visit: Payer: Self-pay | Admitting: Obstetrics & Gynecology

## 2015-09-10 ENCOUNTER — Other Ambulatory Visit (HOSPITAL_COMMUNITY)
Admission: RE | Admit: 2015-09-10 | Discharge: 2015-09-10 | Disposition: A | Discharge status: Home / Self Care | Payer: BLUE CROSS/BLUE SHIELD | Source: Ambulatory Visit | Attending: Obstetrics & Gynecology | Admitting: Obstetrics & Gynecology

## 2015-09-10 ENCOUNTER — Ambulatory Visit (INDEPENDENT_AMBULATORY_CARE_PROVIDER_SITE_OTHER): Payer: BLUE CROSS/BLUE SHIELD | Admitting: Obstetrics & Gynecology

## 2015-09-10 ENCOUNTER — Encounter: Payer: Self-pay | Admitting: Obstetrics & Gynecology

## 2015-09-10 VITALS — BP 100/60 | HR 72 | Ht 67.0 in | Wt 137.4 lb

## 2015-09-10 DIAGNOSIS — Z01419 Encounter for gynecological examination (general) (routine) without abnormal findings: Secondary | ICD-10-CM | POA: Diagnosis present

## 2015-09-10 NOTE — Progress Notes (Signed)
Patient ID: [name], female   DOB: Nov 09, 1975, 39 y.o.   MRN: [id number] Subjective:     [name] is a 39 y.o. female here for a routine exam.  Patient's last menstrual period was 09/01/2015. G2P1100 Birth Control Method:  Laparoscopic BTL Menstrual Calendar(currently): regular  Current complaints: none.   Current acute medical issues:  none   Recent Gynecologic History Patient's last menstrual period was 09/01/2015. Last Pap: 2015,  normal Last mammogram: ,    Past Medical History  Diagnosis Date  . Seasonal allergies     Past Surgical History  Procedure Laterality Date  . Cesarean section  2008,2011  . Tubal ligation      OB History    Gravida Para Term Preterm AB TAB SAB Ectopic Multiple Living   Social History   Social History  . Marital Status: Married    Spouse Name: N/A  . Number of Children: N/A  . Years of Education: N/A   Social History Main Topics  . Smoking status: Never Smoker   . Smokeless tobacco: Never Used  . Alcohol Use: No  . Drug Use: No  . Sexual Activity: Yes    Birth Control/ Protection: Surgical   Other Topics Concern  . None   Social History Narrative    Family History  Problem Relation Age of Onset  . Hypertension Mother   . Hyperlipidemia Mother   . Rheum arthritis Mother   . Cancer Other     breast  . Diabetes Maternal Aunt   . Hypertension Maternal Grandmother      Current outpatient prescriptions:  .  cetirizine (ZYRTEC) 10 MG tablet, Take 10 mg by mouth daily., Disp: , Rfl:  .  FLUoxetine (PROZAC) 10 MG tablet, Take 10 mg by mouth daily., Disp: , Rfl:  .  ibuprofen (ADVIL,MOTRIN) 200 MG tablet, Take 200 mg by mouth every 6 (six) hours as needed., Disp: , Rfl:  .  montelukast (SINGULAIR) 10 MG tablet, Take 10 mg by mouth at bedtime., Disp: , Rfl:   Review of Systems  Review of Systems  Constitutional: Negative for fever, chills, weight loss, malaise/fatigue and diaphoresis.   HENT: Negative for hearing loss, ear pain, nosebleeds, congestion, sore throat, neck pain, tinnitus and ear discharge.   Eyes: Negative for blurred vision, double vision, photophobia, pain, discharge and redness.  Respiratory: Negative for cough, hemoptysis, sputum production, shortness of breath, wheezing and stridor.   Cardiovascular: Negative for chest pain, palpitations, orthopnea, claudication, leg swelling and PND.  Gastrointestinal: negative for abdominal pain. Negative for heartburn, nausea, vomiting, diarrhea, constipation, blood in stool and melena.  Genitourinary: Negative for dysuria, urgency, frequency, hematuria and flank pain.  Musculoskeletal: Negative for myalgias, back pain, joint pain and falls.  Skin: Negative for itching and rash.  Neurological: Negative for dizziness, tingling, tremors, sensory change, speech change, focal weakness, seizures, loss of consciousness, weakness and headaches.  Endo/Heme/Allergies: Negative for environmental allergies and polydipsia. Does not bruise/bleed easily.  Psychiatric/Behavioral: Negative for depression, suicidal ideas, hallucinations, memory loss and substance abuse. The patient is not nervous/anxious and does not have insomnia.        Objective:  Blood pressure 100/60, pulse 72, height  (1.702 m), weight 137 lb 6.4 oz (62.324 kg), last menstrual period 09/01/2015.   Physical Exam  Vitals reviewed. Constitutional: She is oriented to person, place, and time. She appears well-developed and well-nourished.  HENT:  Head: Normocephalic and atraumatic.        Right Ear: External ear normal.  Left Ear: External ear normal.  Nose: Nose normal.  Mouth/Throat: Oropharynx is clear and moist.  Eyes: Conjunctivae and EOM are normal. Pupils are equal, round, and reactive to light. Right eye exhibits no discharge. Left eye exhibits no discharge. No scleral icterus.  Neck: Normal range of motion. Neck supple. No tracheal deviation present.  No thyromegaly present.  Cardiovascular: Normal rate, regular rhythm, normal heart sounds and intact distal pulses.  Exam reveals no gallop and no friction rub.   No murmur heard. Respiratory: Effort normal and breath sounds normal. No respiratory distress. She has no wheezes. She has no rales. She exhibits no tenderness.  GI: Soft. Bowel sounds are normal. She exhibits no distension and no mass. There is no tenderness. There is no rebound and no guarding.  Genitourinary:  Breasts no masses skin changes or nipple changes bilaterally      Vulva is normal without lesions Vagina is pink moist without discharge Cervix normal in appearance and pap is done Uterus is normal size shape and contour Adnexa is negative with normal sized ovaries   Musculoskeletal: Normal range of motion. She exhibits no edema and no tenderness.  Neurological: She is alert and oriented to person, place, and time. She has normal reflexes. She displays normal reflexes. No cranial nerve deficit. She exhibits normal muscle tone. Coordination normal.  Skin: Skin is warm and dry. No rash noted. No erythema. No pallor.  Psychiatric: She has a normal mood and affect. Her behavior is normal. Judgment and thought content normal.       Assessment:    Healthy female exam.    Plan:    Follow up in: 1 year.

## 2015-09-12 LAB — CYTOLOGY - PAP

## 2016-09-22 ENCOUNTER — Ambulatory Visit (INDEPENDENT_AMBULATORY_CARE_PROVIDER_SITE_OTHER): Payer: BLUE CROSS/BLUE SHIELD | Admitting: Obstetrics & Gynecology

## 2016-09-22 ENCOUNTER — Other Ambulatory Visit (HOSPITAL_COMMUNITY)
Admission: RE | Admit: 2016-09-22 | Discharge: 2016-09-22 | Disposition: A | Discharge status: Home / Self Care | Payer: BLUE CROSS/BLUE SHIELD | Source: Ambulatory Visit | Attending: Obstetrics & Gynecology | Admitting: Obstetrics & Gynecology

## 2016-09-22 ENCOUNTER — Encounter: Payer: Self-pay | Admitting: Obstetrics & Gynecology

## 2016-09-22 VITALS — BP 100/80 | HR 92 | Ht 66.0 in | Wt 141.4 lb

## 2016-09-22 DIAGNOSIS — Z01419 Encounter for gynecological examination (general) (routine) without abnormal findings: Secondary | ICD-10-CM

## 2016-09-22 MED ORDER — MONTELUKAST SODIUM 10 MG PO TABS: 10.0000 mg | ORAL_TABLET | Freq: Every day | ORAL | 11 refills | Status: DC

## 2016-09-22 NOTE — Progress Notes (Signed)
Subjective:     [name] is a 40 y.o. female here for a routine exam.  Patient's last menstrual period was 09/13/2016. G2P1100 Birth Control Method:  Tubal ligation Menstrual Calendar(currently): regular normal   Current complaints: none.   Current acute medical issues:  none   Recent Gynecologic History Patient's last menstrual period was 09/13/2016. Last Pap: 2016,  normal Last mammogram: ,    Past Medical History:  Diagnosis Date  . Seasonal allergies     Past Surgical History:  Procedure Laterality Date  . CESAREAN SECTION  [id number],2011  . TUBAL LIGATION      OB History    Gravida Para Term Preterm AB Living   2 2 1 1        SAB TAB Ectopic Multiple Live Births                  Social History   Social History  . Marital status: Married    Spouse name: N/A  . Number of children: N/A  . Years of education: N/A   Social History Main Topics  . Smoking status: Never Smoker  . Smokeless tobacco: Never Used  . Alcohol use No  . Drug use: No  . Sexual activity: Yes    Birth control/ protection: Surgical   Other Topics Concern  . None   Social History Narrative  . None    Family History  Problem Relation Age of Onset  . Hypertension Mother   . Hyperlipidemia Mother   . Rheum arthritis Mother   . Cancer Other     breast  . Diabetes Maternal Aunt   . Hypertension Maternal Grandmother      Current Outpatient Prescriptions:  .  cetirizine (ZYRTEC) 10 MG tablet, Take 10 mg by mouth daily., Disp: , Rfl:  .  ibuprofen (ADVIL,MOTRIN) 200 MG tablet, Take 200 mg by mouth every 6 (six) hours as needed., Disp: , Rfl:  .  montelukast (SINGULAIR) 10 MG tablet, Take 10 mg by mouth at bedtime., Disp: , Rfl:   Review of Systems  Review of Systems  Constitutional: Negative for fever, chills, weight loss, malaise/fatigue and diaphoresis.  HENT: Negative for hearing loss, ear pain, nosebleeds, congestion, sore throat, neck pain, tinnitus and ear discharge.    Eyes: Negative for blurred vision, double vision, photophobia, pain, discharge and redness.  Respiratory: Negative for cough, hemoptysis, sputum production, shortness of breath, wheezing and stridor.   Cardiovascular: Negative for chest pain, palpitations, orthopnea, claudication, leg swelling and PND.  Gastrointestinal: negative for abdominal pain. Negative for heartburn, nausea, vomiting, diarrhea, constipation, blood in stool and melena.  Genitourinary: Negative for dysuria, urgency, frequency, hematuria and flank pain.  Musculoskeletal: Negative for myalgias, back pain, joint pain and falls.  Skin: Negative for itching and rash.  Neurological: Negative for dizziness, tingling, tremors, sensory change, speech change, focal weakness, seizures, loss of consciousness, weakness and headaches.  Endo/Heme/Allergies: Negative for environmental allergies and polydipsia. Does not bruise/bleed easily.  Psychiatric/Behavioral: Negative for depression, suicidal ideas, hallucinations, memory loss and substance abuse. The patient is not nervous/anxious and does not have insomnia.        Objective:  Blood pressure 100/80, pulse 92, height 5\' 6"  (1.676 m), weight 141 lb 6.4 oz (64.1 kg), last menstrual period 09/13/2016.   Physical Exam  Vitals reviewed. Constitutional: She is oriented to person, place, and time. She appears well-developed and well-nourished.  HENT:  Head: Normocephalic and atraumatic.        Right Ear:  External ear normal.  Left Ear: External ear normal.  Nose: Nose normal.  Mouth/Throat: Oropharynx is clear and moist.  Eyes: Conjunctivae and EOM are normal. Pupils are equal, round, and reactive to light. Right eye exhibits no discharge. Left eye exhibits no discharge. No scleral icterus.  Neck: Normal range of motion. Neck supple. No tracheal deviation present. No thyromegaly present.  Cardiovascular: Normal rate, regular rhythm, normal heart sounds and intact distal pulses.  Exam  reveals no gallop and no friction rub.   No murmur heard. Respiratory: Effort normal and breath sounds normal. No respiratory distress. She has no wheezes. She has no rales. She exhibits no tenderness.  GI: Soft. Bowel sounds are normal. She exhibits no distension and no mass. There is no tenderness. There is no rebound and no guarding.  Genitourinary:  Breasts no masses skin changes or nipple changes bilaterally      Vulva is normal without lesions Vagina is pink moist without discharge Cervix normal in appearance and pap is done Uterus is normal size shape and contour Adnexa is negative with normal sized ovaries   Musculoskeletal: Normal range of motion. She exhibits no edema and no tenderness.  Neurological: She is alert and oriented to person, place, and time. She has normal reflexes. She displays normal reflexes. No cranial nerve deficit. She exhibits normal muscle tone. Coordination normal.  Skin: Skin is warm and dry. No rash noted. No erythema. No pallor.  Psychiatric: She has a normal mood and affect. Her behavior is normal. Judgment and thought content normal.       Medications Ordered at today's visit: No orders of the defined types were placed in this encounter.   Other orders placed at today's visit: No orders of the defined types were placed in this encounter.     Assessment:    Healthy female exam.    Plan:    Contraception: tubal ligation. Follow up in: 1 years.     Return in about 2 years (around 09/22/2018) for yearly, with Dr Despina HiddenEure.

## 2016-09-22 NOTE — Addendum Note (Signed)
Addended by: Lazaro ArmsEURE, Georgena Weisheit H on: 09/22/2016 11:07 AM   Modules accepted: Orders

## 2016-09-23 LAB — CYTOLOGY - PAP
ADEQUACY: ABSENT
DIAGNOSIS: NEGATIVE

## 2019-02-21 ENCOUNTER — Other Ambulatory Visit: Payer: Self-pay | Admitting: Obstetrics & Gynecology

## 2020-02-08 ENCOUNTER — Telehealth: Payer: Self-pay | Admitting: Obstetrics & Gynecology

## 2020-02-08 NOTE — Telephone Encounter (Signed)

## 2020-02-12 ENCOUNTER — Encounter: Payer: Self-pay | Admitting: Obstetrics & Gynecology

## 2020-02-12 ENCOUNTER — Other Ambulatory Visit (HOSPITAL_COMMUNITY)
Admission: RE | Admit: 2020-02-12 | Discharge: 2020-02-12 | Disposition: A | Discharge status: Home / Self Care | Payer: Medicaid Other | Source: Ambulatory Visit | Attending: Obstetrics & Gynecology | Admitting: Obstetrics & Gynecology

## 2020-02-12 ENCOUNTER — Other Ambulatory Visit: Payer: Self-pay

## 2020-02-12 ENCOUNTER — Ambulatory Visit (INDEPENDENT_AMBULATORY_CARE_PROVIDER_SITE_OTHER): Payer: Medicaid Other | Admitting: Obstetrics & Gynecology

## 2020-02-12 VITALS — BP 120/78 | HR 83 | Ht 66.0 in | Wt 135.0 lb

## 2020-02-12 DIAGNOSIS — Z01419 Encounter for gynecological examination (general) (routine) without abnormal findings: Secondary | ICD-10-CM

## 2020-02-12 DIAGNOSIS — Z0000 Encounter for general adult medical examination without abnormal findings: Secondary | ICD-10-CM

## 2020-02-12 NOTE — Progress Notes (Signed)
Subjective:     [name] is a 44 y.o. female here for a routine exam.  Patient's last menstrual period was 01/27/2020 (exact date). G2P1100 Birth Control Method:  BTL Menstrual Calendar(currently): regular  Current complaints: no.   Current acute medical issues:  none   Recent Gynecologic History Patient's last menstrual period was 01/27/2020 (exact date). Last Pap: 2017,  normal Last mammogram: never,    Past Medical History:  Diagnosis Date  . Seasonal allergies     Past Surgical History:  Procedure Laterality Date  . CESAREAN SECTION  [id number]  . TUBAL LIGATION      OB History    Gravida  2   Para  2   Term  1   Preterm  1   AB      Living        SAB      TAB      Ectopic      Multiple      Live Births              Social History   Socioeconomic History  . Marital status: Married    Spouse name: Not on file  . Number of children: Not on file  . Years of education: Not on file  . Highest education level: Not on file  Occupational History  . Not on file  Tobacco Use  . Smoking status: Never Smoker  . Smokeless tobacco: Never Used  Substance and Sexual Activity  . Alcohol use: No  . Drug use: No  . Sexual activity: Yes    Birth control/protection: Surgical  Other Topics Concern  . Not on file  Social History Narrative  . Not on file   Social Determinants of Health   Financial Resource Strain:   . Difficulty of Paying Living Expenses:   Food Insecurity: No Food Insecurity  . Worried About Programme researcher, broadcasting/film/video in the Last Year: Never true  . Ran Out of Food in the Last Year: Never true  Transportation Needs: No Transportation Needs  . Lack of Transportation (Medical): No  . Lack of Transportation (Non-Medical): No  Physical Activity: Insufficiently Active  . Days of Exercise per Week: 3 days  . Minutes of Exercise per Session: 30 min  Stress: No Stress Concern Present  . Feeling of Stress : Only a little  Social  Connections: Not Isolated  . Frequency of Communication with Friends and Family: More than three times a week  . Frequency of Social Gatherings with Friends and Family: Twice a week  . Attends Religious Services: More than 4 times per year  . Active Member of Clubs or Organizations: Yes  . Attends Banker Meetings: 1 to 4 times per year  . Marital Status: Married    Family History  Problem Relation Age of Onset  . Hypertension Mother   . Hyperlipidemia Mother   . Rheum arthritis Mother   . Cancer Other        breast  . Diabetes Maternal Aunt   . Hypertension Maternal Grandmother      Current Outpatient Medications:  .  cetirizine (ZYRTEC) 10 MG tablet, Take 10 mg by mouth daily., Disp: , Rfl:  .  ibuprofen (ADVIL,MOTRIN) 200 MG tablet, Take 200 mg by mouth every 6 (six) hours as needed., Disp: , Rfl:  .  montelukast (SINGULAIR) 10 MG tablet, Take 1 tablet (10 mg total) by mouth at bedtime., Disp: 30 tablet, Rfl: 11  Review  of Systems  Review of Systems  Constitutional: Negative for fever, chills, weight loss, malaise/fatigue and diaphoresis.  HENT: Negative for hearing loss, ear pain, nosebleeds, congestion, sore throat, neck pain, tinnitus and ear discharge.   Eyes: Negative for blurred vision, double vision, photophobia, pain, discharge and redness.  Respiratory: Negative for cough, hemoptysis, sputum production, shortness of breath, wheezing and stridor.   Cardiovascular: Negative for chest pain, palpitations, orthopnea, claudication, leg swelling and PND.  Gastrointestinal: negative for abdominal pain. Negative for heartburn, nausea, vomiting, diarrhea, constipation, blood in stool and melena.  Genitourinary: Negative for dysuria, urgency, frequency, hematuria and flank pain.  Musculoskeletal: Negative for myalgias, back pain, joint pain and falls.  Skin: Negative for itching and rash.  Neurological: Negative for dizziness, tingling, tremors, sensory change,  speech change, focal weakness, seizures, loss of consciousness, weakness and headaches.  Endo/Heme/Allergies: Negative for environmental allergies and polydipsia. Does not bruise/bleed easily.  Psychiatric/Behavioral: Negative for depression, suicidal ideas, hallucinations, memory loss and substance abuse. The patient is not nervous/anxious and does not have insomnia.        Objective:  Blood pressure 120/78, pulse 83, height 5\' 6"  (1.676 m), weight 135 lb (61.2 kg), last menstrual period 01/27/2020.   Physical Exam  Vitals reviewed. Constitutional: She is oriented to person, place, and time. She appears well-developed and well-nourished.  HENT:  Head: Normocephalic and atraumatic.        Right Ear: External ear normal.  Left Ear: External ear normal.  Nose: Nose normal.  Mouth/Throat: Oropharynx is clear and moist.  Eyes: Conjunctivae and EOM are normal. Pupils are equal, round, and reactive to light. Right eye exhibits no discharge. Left eye exhibits no discharge. No scleral icterus.  Neck: Normal range of motion. Neck supple. No tracheal deviation present. No thyromegaly present.  Cardiovascular: Normal rate, regular rhythm, normal heart sounds and intact distal pulses.  Exam reveals no gallop and no friction rub.   No murmur heard. Respiratory: Effort normal and breath sounds normal. No respiratory distress. She has no wheezes. She has no rales. She exhibits no tenderness.  GI: Soft. Bowel sounds are normal. She exhibits no distension and no mass. There is no tenderness. There is no rebound and no guarding.  Genitourinary:  Breasts no masses skin changes or nipple changes bilaterally      Vulva is normal without lesions Vagina is pink moist without discharge Cervix normal in appearance and pap is done Uterus is normal size shape and contour Adnexa is negative with normal sized ovaries   Musculoskeletal: Normal range of motion. She exhibits no edema and no tenderness.   Neurological: She is alert and oriented to person, place, and time. She has normal reflexes. She displays normal reflexes. No cranial nerve deficit. She exhibits normal muscle tone. Coordination normal.  Skin: Skin is warm and dry. No rash noted. No erythema. No pallor.  Psychiatric: She has a normal mood and affect. Her behavior is normal. Judgment and thought content normal.       Medications Ordered at today's visit: No orders of the defined types were placed in this encounter.   Other orders placed at today's visit: No orders of the defined types were placed in this encounter.     Assessment:    Normal Gyn exam.    Plan:    Contraception: tubal ligation and vaginal spermicide. Mammogram ordered. Follow up in: 3 years.     No follow-ups on file.

## 2020-02-15 LAB — CYTOLOGY - PAP
Comment: NEGATIVE
Diagnosis: NEGATIVE
Diagnosis: REACTIVE
High risk HPV: NEGATIVE

## 2023-08-04 ENCOUNTER — Encounter: Payer: Self-pay | Admitting: Obstetrics & Gynecology

## 2023-08-04 ENCOUNTER — Other Ambulatory Visit (HOSPITAL_COMMUNITY)
Admission: RE | Admit: 2023-08-04 | Discharge: 2023-08-04 | Disposition: A | Discharge status: Home / Self Care | Payer: Medicaid Other | Source: Ambulatory Visit | Attending: Obstetrics & Gynecology | Admitting: Obstetrics & Gynecology

## 2023-08-04 ENCOUNTER — Other Ambulatory Visit (HOSPITAL_COMMUNITY): Payer: Self-pay | Admitting: Obstetrics & Gynecology

## 2023-08-04 ENCOUNTER — Ambulatory Visit (INDEPENDENT_AMBULATORY_CARE_PROVIDER_SITE_OTHER): Payer: Medicaid Other | Admitting: Obstetrics & Gynecology

## 2023-08-04 VITALS — BP 115/79 | HR 85 | Ht 66.0 in | Wt 140.0 lb

## 2023-08-04 DIAGNOSIS — Z01419 Encounter for gynecological examination (general) (routine) without abnormal findings: Secondary | ICD-10-CM | POA: Insufficient documentation

## 2023-08-04 DIAGNOSIS — Z1231 Encounter for screening mammogram for malignant neoplasm of breast: Secondary | ICD-10-CM

## 2023-08-04 NOTE — Progress Notes (Signed)
WELL-WOMAN EXAMINATION Patient name: [name] MRN [id number]  Date of birth: 1975-12-09 Chief Complaint:   annual  History of Present Illness:   [name] is a 47 y.o. G17P1100 female being seen today for a routine well-woman exam.   Menses typically last only 2-3 days, typically every 3-4wks.  Denies intermenstrual bleeding, HMB or significant dysmenorrhea  She has started to have hot flashes- rates her symptoms 6/10.  States her symptoms are "not that bad" and usually resolve pretty quickly once she is in front of a fan.   Patient's last menstrual period was 07/14/2023. Denies issues with her menses The current method of family planning is tubal ligation.    Last pap collected today.  Last mammogram: ordered-needs to complete. Last colonoscopy: to be discussed with PCP in [facility]     08/04/2023    9:29 AM 08/04/2023    9:28 AM 02/12/2020    2:39 PM  Depression screen PHQ 2/9  Decreased Interest 0 0 0  Down, Depressed, Hopeless 0 0 0  PHQ - 2 Score 0 0 0  Altered sleeping 0 0 0  Tired, decreased energy 1 1 0  Change in appetite 0 0 0  Feeling bad or failure about yourself  0 0 0  Trouble concentrating 1 1 0  Moving slowly or fidgety/restless 0 0 0  Suicidal thoughts 0 0 0  PHQ-9 Score 2 2 0  Difficult doing work/chores   Not difficult at all      Review of Systems:   Pertinent items are noted in HPI Denies any headaches, blurred vision, fatigue, shortness of breath, chest pain, abdominal pain, bowel movements, urination, or intercourse unless otherwise stated above.  Pertinent History Reviewed:  Reviewed past medical,surgical, social and family history.  Reviewed problem list, medications and allergies. Physical Assessment:   Vitals:   08/04/23 0930  BP: 115/79  Pulse: 85  Weight: 140 lb (63.5 kg)  Height: 5\' 6"  (1.676 m)  Body mass index is 22.6 kg/m.        Physical Examination:   General appearance - well appearing, and in no  distress  Mental status - alert, oriented to person, place, and time  Psych:  She has a normal mood and affect  Skin - warm and dry, normal color, no suspicious lesions noted  Chest - effort normal, all lung fields clear to auscultation bilaterally  Heart - normal rate and regular rhythm  Neck:  midline trachea, no thyromegaly or nodules  Breasts - breasts appear normal, no suspicious masses, no skin or nipple changes or  axillary nodes  Abdomen - soft, nontender, nondistended, no masses or organomegaly  Pelvic - VULVA: normal appearing vulva with no masses, tenderness or lesions  VAGINA: normal appearing vagina with normal color and discharge, no lesions  CERVIX: normal appearing cervix without discharge or lesions, no CMT  Thin prep pap is done with HR HPV cotesting  UTERUS: uterus is felt to be normal size, shape, consistency and nontender   ADNEXA: No adnexal masses or tenderness noted.  Extremities:  No swelling or varicosities noted  Chaperone: Faith Rogue     Assessment & Plan:  1) Well-Woman Exam -pap collected, reviewed screening guidelines -Mammogram order patient is aware she needs to schedule  2) Hot flashes -Reviewed perimenopausal status and potential symptoms -Discussed conservative management including herbal supplements -Briefly discussed hormone replacement therapy should symptoms worsen or significantly start to impact quality of life -f/u prn   Meds: No  orders of the defined types were placed in this encounter.   Follow-up: Return in about 1 year (around 08/03/2024) for Annual.   Myna Hidalgo, DO Attending Obstetrician & Gynecologist, [facility], [facility]

## 2023-08-09 LAB — CYTOLOGY - PAP
Chlamydia: NEGATIVE
Comment: NEGATIVE
Comment: NEGATIVE
Comment: NORMAL
Diagnosis: NEGATIVE
High risk HPV: NEGATIVE
Neisseria Gonorrhea: NEGATIVE

## 2023-08-18 ENCOUNTER — Ambulatory Visit (HOSPITAL_COMMUNITY)
Admission: RE | Admit: 2023-08-18 | Discharge: 2023-08-18 | Disposition: A | Discharge status: Home / Self Care | Payer: Medicaid Other | Source: Ambulatory Visit | Attending: Obstetrics & Gynecology | Admitting: Obstetrics & Gynecology

## 2023-08-18 ENCOUNTER — Encounter (HOSPITAL_COMMUNITY): Payer: Self-pay

## 2023-08-18 DIAGNOSIS — Z1231 Encounter for screening mammogram for malignant neoplasm of breast: Secondary | ICD-10-CM | POA: Diagnosis present

## 2024-07-31 ENCOUNTER — Other Ambulatory Visit (HOSPITAL_COMMUNITY): Payer: Self-pay | Admitting: Obstetrics & Gynecology

## 2024-07-31 DIAGNOSIS — Z1231 Encounter for screening mammogram for malignant neoplasm of breast: Secondary | ICD-10-CM

## 2024-08-21 ENCOUNTER — Ambulatory Visit (HOSPITAL_COMMUNITY)

## 2024-08-23 ENCOUNTER — Ambulatory Visit (HOSPITAL_COMMUNITY)
Admission: RE | Admit: 2024-08-23 | Discharge: 2024-08-23 | Disposition: A | Discharge status: Home / Self Care | Source: Ambulatory Visit | Attending: Obstetrics & Gynecology | Admitting: Obstetrics & Gynecology

## 2024-08-23 ENCOUNTER — Encounter (HOSPITAL_COMMUNITY): Payer: Self-pay

## 2024-08-23 DIAGNOSIS — Z1231 Encounter for screening mammogram for malignant neoplasm of breast: Secondary | ICD-10-CM | POA: Diagnosis present

## 2024-08-28 ENCOUNTER — Other Ambulatory Visit (HOSPITAL_COMMUNITY): Payer: Self-pay | Admitting: Obstetrics & Gynecology

## 2024-08-28 ENCOUNTER — Ambulatory Visit: Payer: Self-pay | Admitting: Obstetrics & Gynecology

## 2024-08-28 ENCOUNTER — Telehealth: Payer: Self-pay

## 2024-08-28 DIAGNOSIS — R928 Other abnormal and inconclusive findings on diagnostic imaging of breast: Secondary | ICD-10-CM

## 2024-08-28 NOTE — Progress Notes (Signed)
 Pt to be contacted regarding follow up imaging- please confirm that pt is aware of follow up

## 2024-08-28 NOTE — Telephone Encounter (Signed)
 Rn called patient to make she sure was aware that further imaging needed to be had based on previous mammogram. Patient stated she has an appointment for the imaging Thursday afternoon. All questions answered.

## 2024-08-31 ENCOUNTER — Encounter (HOSPITAL_COMMUNITY): Payer: Self-pay

## 2024-08-31 ENCOUNTER — Ambulatory Visit (HOSPITAL_COMMUNITY)
Admission: RE | Admit: 2024-08-31 | Discharge: 2024-08-31 | Disposition: A | Discharge status: Home / Self Care | Source: Ambulatory Visit | Attending: Obstetrics & Gynecology | Admitting: Obstetrics & Gynecology

## 2024-08-31 ENCOUNTER — Other Ambulatory Visit (HOSPITAL_COMMUNITY): Payer: Self-pay | Admitting: Obstetrics & Gynecology

## 2024-08-31 DIAGNOSIS — R928 Other abnormal and inconclusive findings on diagnostic imaging of breast: Secondary | ICD-10-CM | POA: Insufficient documentation

## 2024-09-01 ENCOUNTER — Ambulatory Visit: Payer: Self-pay | Admitting: Obstetrics & Gynecology

## 2024-09-07 ENCOUNTER — Other Ambulatory Visit (HOSPITAL_COMMUNITY): Payer: Self-pay | Admitting: Obstetrics & Gynecology

## 2024-09-07 DIAGNOSIS — R928 Other abnormal and inconclusive findings on diagnostic imaging of breast: Secondary | ICD-10-CM

## 2024-09-12 ENCOUNTER — Encounter (HOSPITAL_COMMUNITY): Payer: Self-pay

## 2024-09-12 ENCOUNTER — Ambulatory Visit (HOSPITAL_COMMUNITY)
Admission: RE | Admit: 2024-09-12 | Discharge: 2024-09-12 | Disposition: A | Discharge status: Home / Self Care | Source: Ambulatory Visit | Attending: Obstetrics & Gynecology | Admitting: Obstetrics & Gynecology

## 2024-09-12 DIAGNOSIS — R928 Other abnormal and inconclusive findings on diagnostic imaging of breast: Secondary | ICD-10-CM

## 2024-09-12 HISTORY — PX: BREAST BIOPSY: SHX20

## 2024-09-12 MED ORDER — LIDOCAINE HCL (PF) 2 % IJ SOLN
INTRAMUSCULAR | Status: AC
  Filled 2024-09-12: qty 10

## 2024-09-12 MED ORDER — LIDOCAINE-EPINEPHRINE (PF) 1 %-1:200000 IJ SOLN
INTRAMUSCULAR | Status: AC
  Filled 2024-09-12: qty 30

## 2024-09-13 LAB — SURGICAL PATHOLOGY

## 2024-09-20 ENCOUNTER — Encounter: Payer: Self-pay | Admitting: Obstetrics & Gynecology

## 2024-09-20 ENCOUNTER — Ambulatory Visit: Admitting: Obstetrics & Gynecology

## 2024-09-20 VITALS — BP 107/71 | HR 76 | Ht 66.0 in | Wt 147.6 lb

## 2024-09-20 DIAGNOSIS — Z1331 Encounter for screening for depression: Secondary | ICD-10-CM

## 2024-09-20 DIAGNOSIS — Z01419 Encounter for gynecological examination (general) (routine) without abnormal findings: Secondary | ICD-10-CM

## 2024-09-20 NOTE — Progress Notes (Signed)
 WELL-WOMAN EXAMINATION Patient name: [name] MRN [id number]  Date of birth: [birth year] Chief Complaint:   Annual Exam  History of Present Illness:   [name] is a 48 y.o. G72P1100 female being seen today for a routine well-woman exam.   Menses unchanged- yypically last only 2-3 days, every 3-4wks.  Denies intermenstrual bleeding, HMB or significant dysmenorrhea.  Still having occasional vasomotor symptoms, but notes they are tolerable.  She did try some herbal supplements and noted some small change but does not take them regularly   Denies irregular discharge, itching or irritation.  Denies pelvic or abdominal pain.  Reports no acute GYN concern  Still has some ecchymoses from recent breast biopsy, results benign  Patient's last menstrual period was 09/17/2024 (exact date).  The current method of family planning is tubal ligation.    Last pap 08/2023.  Last mammogram: 09/2024. Last colonoscopy: cologuard on order     08/04/2023    9:29 AM 08/04/2023    9:28 AM 02/12/2020    2:39 PM  Depression screen PHQ 2/9  Decreased Interest 0 0 0  Down, Depressed, Hopeless 0 0 0  PHQ - 2 Score 0 0 0  Altered sleeping 0 0 0  Tired, decreased energy 1 1 0  Change in appetite 0 0 0  Feeling bad or failure about yourself  0 0 0  Trouble concentrating 1 1 0  Moving slowly or fidgety/restless 0 0 0  Suicidal thoughts 0 0 0  PHQ-9 Score 2  2  0   Difficult doing work/chores   Not difficult at all     Data saved with a previous flowsheet row definition      Review of Systems:   Pertinent items are noted in HPI Denies any headaches, blurred vision, fatigue, shortness of breath, chest pain, abdominal pain, bowel movements, urination, or intercourse unless otherwise stated above.  Pertinent History Reviewed:  Reviewed past medical,surgical, social and family history.  Reviewed problem list, medications and allergies. Physical Assessment:   Vitals:   09/20/24 1111   BP: 107/71  Pulse: 76  Weight: 147 lb 9.6 oz (67 kg)  Height: 5' 6 (1.676 m)  Body mass index is 23.82 kg/m.        Physical Examination:   General appearance - well appearing, and in no distress  Mental status - alert, oriented to person, place, and time  Psych:  She has a normal mood and affect  Skin - warm and dry, normal color, no suspicious lesions noted  Chest - effort normal, all lung fields clear to auscultation bilaterally  Heart - normal rate and regular rhythm  Neck:  midline trachea, no thyromegaly or nodules  Breasts - breasts appear normal- left outer breast with ecchymosis noted, no suspicious masses, no skin or nipple changes or  axillary nodes  Abdomen - soft, nontender, nondistended, no masses or organomegaly  Pelvic - VULVA: normal appearing vulva with no masses, tenderness or lesions  VAGINA: normal appearing vagina with normal color and discharge, no lesions  CERVIX: normal appearing cervix without discharge or lesions, no CMT  UTERUS: uterus is felt to be normal size, shape, consistency and nontender   ADNEXA: No adnexal masses or tenderness noted.  Extremities:  No swelling or varicosities noted  Chaperone: pt declined     Assessment & Plan:  1) Well-Woman Exam -Pap up-to-date - Mammogram up-to-date - In the process of Cologuard  2) vasomotor symptoms - Doing well with conservative therapy  No orders of the defined types were placed in this encounter.   Meds: No orders of the defined types were placed in this encounter.   Follow-up: Return in about 1 year (around 09/20/2025) for Annual.   Breniyah Romm, DO Attending Obstetrician & Gynecologist, [facility], [facility]
